# Patient Record
Sex: Female | Born: 1955 | Race: Black or African American | Hispanic: No | Marital: Married | Smoking: Former smoker
Health system: Southern US, Community
[De-identification: ages and names within clinical notes are randomized; demographics above are authoritative.]

## PROBLEM LIST (undated history)

## (undated) DIAGNOSIS — I10 Essential (primary) hypertension: Secondary | ICD-10-CM

## (undated) DIAGNOSIS — B1920 Unspecified viral hepatitis C without hepatic coma: Secondary | ICD-10-CM

---

## 2005-02-02 ENCOUNTER — Other Ambulatory Visit: Admission: RE | Admit: 2005-02-02 | Discharge: 2005-02-02 | Payer: Self-pay | Admitting: Obstetrics and Gynecology

## 2008-03-24 ENCOUNTER — Encounter: Admission: RE | Admit: 2008-03-24 | Discharge: 2008-03-24 | Payer: Self-pay | Admitting: Family Medicine

## 2008-03-30 ENCOUNTER — Encounter: Admission: RE | Admit: 2008-03-30 | Discharge: 2008-03-30 | Payer: Self-pay | Admitting: Family Medicine

## 2008-09-30 ENCOUNTER — Encounter: Admission: RE | Admit: 2008-09-30 | Discharge: 2008-09-30 | Payer: Self-pay | Admitting: Family Medicine

## 2009-04-07 ENCOUNTER — Encounter: Admission: RE | Admit: 2009-04-07 | Discharge: 2009-04-07 | Payer: Self-pay | Admitting: Family Medicine

## 2009-04-22 ENCOUNTER — Ambulatory Visit (HOSPITAL_COMMUNITY): Admission: RE | Admit: 2009-04-22 | Discharge: 2009-04-22 | Payer: Self-pay | Admitting: Obstetrics & Gynecology

## 2009-08-18 ENCOUNTER — Encounter: Admission: RE | Admit: 2009-08-18 | Discharge: 2009-08-18 | Payer: Self-pay | Admitting: Family Medicine

## 2010-05-05 ENCOUNTER — Ambulatory Visit (HOSPITAL_COMMUNITY): Admission: RE | Admit: 2010-05-05 | Discharge: 2010-05-05 | Payer: Self-pay | Admitting: Obstetrics & Gynecology

## 2011-03-13 LAB — BASIC METABOLIC PANEL
BUN: 9 mg/dL (ref 6–23)
CO2: 32 mEq/L (ref 19–32)
Calcium: 9.3 mg/dL (ref 8.4–10.5)
Chloride: 99 mEq/L (ref 96–112)
Creatinine, Ser: 0.75 mg/dL (ref 0.4–1.2)
GFR calc Af Amer: >60 mL/min (ref >60)
GFR calc non Af Amer: >60 mL/min (ref >60)
Glucose, Bld: 96 mg/dL (ref 70–99)
Potassium: 3.3 mEq/L — ABNORMAL LOW (ref 3.5–5.1)
Sodium: 137 mEq/L (ref 135–145)

## 2011-03-13 LAB — CBC
HCT: 43.6 % (ref 36.0–46.0)
Hemoglobin: 15.1 g/dL — ABNORMAL HIGH (ref 12.0–15.0)
MCHC: 34.8 g/dL (ref 30.0–36.0)
MCV: 90.3 fL (ref 78.0–100.0)
Platelets: 194 10*3/uL (ref 150–400)
RBC: 4.82 MIL/uL (ref 3.87–5.11)
RDW: 13 % (ref 11.5–15.5)
WBC: 5.9 10*3/uL (ref 4.0–10.5)

## 2011-05-22 ENCOUNTER — Other Ambulatory Visit: Payer: Self-pay | Admitting: Family Medicine

## 2011-05-22 DIAGNOSIS — Z1231 Encounter for screening mammogram for malignant neoplasm of breast: Secondary | ICD-10-CM

## 2011-06-07 ENCOUNTER — Ambulatory Visit: Payer: Self-pay

## 2011-09-03 ENCOUNTER — Ambulatory Visit: Payer: Self-pay

## 2011-09-06 ENCOUNTER — Ambulatory Visit
Admission: RE | Admit: 2011-09-06 | Discharge: 2011-09-06 | Disposition: A | Discharge status: Home / Self Care | Payer: Federal, State, Local not specified - PPO | Source: Ambulatory Visit | Attending: Family Medicine | Admitting: Family Medicine

## 2011-09-06 DIAGNOSIS — Z1231 Encounter for screening mammogram for malignant neoplasm of breast: Secondary | ICD-10-CM

## 2013-09-22 ENCOUNTER — Other Ambulatory Visit: Payer: Self-pay

## 2013-09-22 DIAGNOSIS — Z1231 Encounter for screening mammogram for malignant neoplasm of breast: Secondary | ICD-10-CM

## 2013-10-16 ENCOUNTER — Ambulatory Visit
Admission: RE | Admit: 2013-10-16 | Discharge: 2013-10-16 | Disposition: A | Discharge status: Home / Self Care | Payer: Federal, State, Local not specified - PPO | Source: Ambulatory Visit

## 2013-10-16 DIAGNOSIS — Z1231 Encounter for screening mammogram for malignant neoplasm of breast: Secondary | ICD-10-CM

## 2013-10-20 ENCOUNTER — Other Ambulatory Visit: Payer: Self-pay | Admitting: Family Medicine

## 2013-10-20 DIAGNOSIS — R928 Other abnormal and inconclusive findings on diagnostic imaging of breast: Secondary | ICD-10-CM

## 2013-11-04 ENCOUNTER — Ambulatory Visit
Admission: RE | Admit: 2013-11-04 | Discharge: 2013-11-04 | Disposition: A | Discharge status: Home / Self Care | Payer: Federal, State, Local not specified - PPO | Source: Ambulatory Visit | Attending: Family Medicine | Admitting: Family Medicine

## 2013-11-04 DIAGNOSIS — R928 Other abnormal and inconclusive findings on diagnostic imaging of breast: Secondary | ICD-10-CM

## 2014-01-13 ENCOUNTER — Other Ambulatory Visit: Payer: Self-pay | Admitting: Internal Medicine

## 2014-01-13 DIAGNOSIS — C220 Liver cell carcinoma: Secondary | ICD-10-CM

## 2014-01-14 ENCOUNTER — Ambulatory Visit: Payer: Self-pay | Admitting: Obstetrics & Gynecology

## 2014-01-22 ENCOUNTER — Ambulatory Visit
Admission: RE | Admit: 2014-01-22 | Discharge: 2014-01-22 | Disposition: A | Discharge status: Home / Self Care | Payer: Federal, State, Local not specified - PPO | Source: Ambulatory Visit | Attending: Internal Medicine | Admitting: Internal Medicine

## 2014-01-22 DIAGNOSIS — C220 Liver cell carcinoma: Secondary | ICD-10-CM

## 2014-05-19 ENCOUNTER — Other Ambulatory Visit: Payer: Self-pay | Admitting: Family Medicine

## 2014-05-19 DIAGNOSIS — R921 Mammographic calcification found on diagnostic imaging of breast: Secondary | ICD-10-CM

## 2014-08-04 ENCOUNTER — Inpatient Hospital Stay: Admission: RE | Admit: 2014-08-04 | Payer: Federal, State, Local not specified - PPO | Source: Ambulatory Visit

## 2014-08-09 ENCOUNTER — Encounter (INDEPENDENT_AMBULATORY_CARE_PROVIDER_SITE_OTHER): Payer: Self-pay

## 2014-08-09 ENCOUNTER — Ambulatory Visit
Admission: RE | Admit: 2014-08-09 | Discharge: 2014-08-09 | Disposition: A | Discharge status: Home / Self Care | Payer: Federal, State, Local not specified - PPO | Source: Ambulatory Visit | Attending: Family Medicine | Admitting: Family Medicine

## 2014-08-09 DIAGNOSIS — R921 Mammographic calcification found on diagnostic imaging of breast: Secondary | ICD-10-CM

## 2014-08-18 ENCOUNTER — Other Ambulatory Visit (HOSPITAL_COMMUNITY): Payer: Self-pay | Admitting: Nurse Practitioner

## 2014-08-18 DIAGNOSIS — B182 Chronic viral hepatitis C: Secondary | ICD-10-CM

## 2014-09-01 ENCOUNTER — Ambulatory Visit (HOSPITAL_COMMUNITY)
Admission: RE | Admit: 2014-09-01 | Discharge: 2014-09-01 | Disposition: A | Discharge status: Home / Self Care | Payer: Federal, State, Local not specified - PPO | Source: Ambulatory Visit | Attending: Nurse Practitioner | Admitting: Nurse Practitioner

## 2014-09-01 DIAGNOSIS — K824 Cholesterolosis of gallbladder: Secondary | ICD-10-CM | POA: Insufficient documentation

## 2014-09-01 DIAGNOSIS — B182 Chronic viral hepatitis C: Secondary | ICD-10-CM | POA: Insufficient documentation

## 2014-10-30 ENCOUNTER — Encounter (HOSPITAL_COMMUNITY): Payer: Self-pay

## 2014-10-30 ENCOUNTER — Emergency Department (HOSPITAL_COMMUNITY)
Admission: EM | Admit: 2014-10-30 | Discharge: 2014-10-30 | Disposition: A | Discharge status: Home / Self Care | Payer: Federal, State, Local not specified - PPO | Attending: Emergency Medicine | Admitting: Emergency Medicine

## 2014-10-30 DIAGNOSIS — M545 Low back pain, unspecified: Secondary | ICD-10-CM

## 2014-10-30 DIAGNOSIS — Z8619 Personal history of other infectious and parasitic diseases: Secondary | ICD-10-CM | POA: Diagnosis not present

## 2014-10-30 DIAGNOSIS — Z87891 Personal history of nicotine dependence: Secondary | ICD-10-CM | POA: Insufficient documentation

## 2014-10-30 DIAGNOSIS — M546 Pain in thoracic spine: Secondary | ICD-10-CM | POA: Diagnosis not present

## 2014-10-30 DIAGNOSIS — I10 Essential (primary) hypertension: Secondary | ICD-10-CM | POA: Insufficient documentation

## 2014-10-30 HISTORY — DX: Unspecified viral hepatitis C without hepatic coma: B19.20

## 2014-10-30 HISTORY — DX: Essential (primary) hypertension: I10

## 2014-10-30 LAB — CBC WITH DIFFERENTIAL/PLATELET
Basophils Absolute: 0 10*3/uL (ref 0.0–0.1)
Basophils Relative: 1 % (ref 0–1)
Eosinophils Absolute: 0 10*3/uL (ref 0.0–0.7)
Eosinophils Relative: 0 % (ref 0–5)
HCT: 43.1 % (ref 36.0–46.0)
Hemoglobin: 14.6 g/dL (ref 12.0–15.0)
Lymphocytes Relative: 52 % — ABNORMAL HIGH (ref 12–46)
Lymphs Abs: 2.8 10*3/uL (ref 0.7–4.0)
MCH: 29.1 pg (ref 26.0–34.0)
MCHC: 33.9 g/dL (ref 30.0–36.0)
MCV: 86 fL (ref 78.0–100.0)
Monocytes Absolute: 0.8 10*3/uL (ref 0.1–1.0)
Monocytes Relative: 15 % — ABNORMAL HIGH (ref 3–12)
Neutro Abs: 1.7 10*3/uL (ref 1.7–7.7)
Neutrophils Relative %: 32 % — ABNORMAL LOW (ref 43–77)
Platelets: 225 10*3/uL (ref 150–400)
RBC: 5.01 MIL/uL (ref 3.87–5.11)
RDW: 12.6 % (ref 11.5–15.5)
WBC: 5.4 10*3/uL (ref 4.0–10.5)

## 2014-10-30 LAB — URINE MICROSCOPIC-ADD ON

## 2014-10-30 LAB — URINALYSIS, ROUTINE W REFLEX MICROSCOPIC
Bilirubin Urine: NEGATIVE
Glucose, UA: NEGATIVE mg/dL
Hgb urine dipstick: NEGATIVE
Ketones, ur: NEGATIVE mg/dL
Nitrite: NEGATIVE
Protein, ur: NEGATIVE mg/dL
Specific Gravity, Urine: 1.017 (ref 1.005–1.030)
Urobilinogen, UA: 0.2 mg/dL (ref 0.0–1.0)
pH: 5.5 (ref 5.0–8.0)

## 2014-10-30 LAB — COMPREHENSIVE METABOLIC PANEL
ALT: 15 U/L (ref 0–35)
AST: 16 U/L (ref 0–37)
Albumin: 3.6 g/dL (ref 3.5–5.2)
Alkaline Phosphatase: 91 U/L (ref 39–117)
Anion gap: 13 (ref 5–15)
BUN: 8 mg/dL (ref 6–23)
CO2: 28 mEq/L (ref 19–32)
Calcium: 9.6 mg/dL (ref 8.4–10.5)
Chloride: 95 mEq/L — ABNORMAL LOW (ref 96–112)
Creatinine, Ser: 0.73 mg/dL (ref 0.50–1.10)
GFR calc Af Amer: >90 mL/min (ref >90)
GFR calc non Af Amer: >90 mL/min (ref >90)
Glucose, Bld: 99 mg/dL (ref 70–99)
Potassium: 3.7 mEq/L (ref 3.7–5.3)
Sodium: 136 mEq/L — ABNORMAL LOW (ref 137–147)
Total Bilirubin: 0.5 mg/dL (ref 0.3–1.2)
Total Protein: 7.7 g/dL (ref 6.0–8.3)

## 2014-10-30 MED ORDER — IBUPROFEN 600 MG PO TABS: 600.0000 mg | ORAL_TABLET | Freq: Three times a day (TID) | ORAL | Status: AC

## 2014-10-30 MED ORDER — DIAZEPAM 5 MG PO TABS
5.0000 mg | ORAL_TABLET | Freq: Once | ORAL | Status: AC
  Administered 2014-10-30: 5 mg via ORAL
  Filled 2014-10-30: qty 1

## 2014-10-30 MED ORDER — HYDROCODONE-ACETAMINOPHEN 5-325 MG PO TABS
1.0000 | ORAL_TABLET | Freq: Four times a day (QID) | ORAL | Status: AC | PRN
Start: 2014-10-30 — End: ?

## 2014-10-30 MED ORDER — KETOROLAC TROMETHAMINE 30 MG/ML IJ SOLN
30.0000 mg | Freq: Once | INTRAMUSCULAR | Status: AC
  Administered 2014-10-30: 30 mg via INTRAMUSCULAR
  Filled 2014-10-30: qty 1

## 2014-10-30 MED ORDER — DIAZEPAM 5 MG PO TABS: 5.0000 mg | ORAL_TABLET | Freq: Two times a day (BID) | ORAL | Status: AC

## 2014-10-30 NOTE — Discharge Instructions (Signed)
As discussed, your evaluation today has been largely reassuring.  But, it is important that you monitor your condition carefully, and do not hesitate to return to the ED if you develop new, or concerning changes in your condition.  Otherwise, please follow-up with your physician for appropriate ongoing care.  For the next 3 days please take all medication as prescribed, and use ice packs, 3 times daily for 20 minutes per application.

## 2014-10-30 NOTE — ED Notes (Signed)
The patient is unable to give an urine specimen at this time. The tech has advised the patient to use the call light for assistance to the restroom. The tech has reported to the RN in charge.

## 2014-10-30 NOTE — ED Notes (Signed)
Pt states "when I eat there is a gas/pressure build up in my back"  Pt reports this has been ongoing x 2 weeks.

## 2014-10-30 NOTE — ED Provider Notes (Signed)
CSN: [id number]     Arrival date & time 10/30/14  1500 History   First MD Initiated Contact with Patient 10/30/14 1628     Chief Complaint  Patient presents with  . Back Pain     HPI  Patient presents with concern of substantial back pain.  He began approximately 10 days ago, the day after the patient arrived after a long car. Since that time the patient has had pain persistently in the mid lower back, greater on the left.  Pain is sore, severe.  Pain does not radiate anteriorly. Pain seemingly is worse with by mouth intake. No change in bowel movements.  No urinary complaints.  No nausea, vomiting, anorexia.  No fever, chills.  Patient has not taken any medication for pain relief, though she did take Rolaids for presumed gas pain.  This did not change her symptoms. Patient has no history of abdominal surgery, beyond cesarean section. Patient does have history of hepatitis C, for which she is currently taking medication.   Past Medical History  Diagnosis Date  . Hypertension   . Hepatitis C    Past Surgical History  Procedure Laterality Date  . Cesarean section     No family history on file. History  Substance Use Topics  . Smoking status: Former Research scientist (life sciences)  . Smokeless tobacco: Not on file  . Alcohol Use: No   OB History    No data available     Review of Systems  Constitutional:       Per HPI, otherwise negative  HENT:       Per HPI, otherwise negative  Respiratory:       Per HPI, otherwise negative  Cardiovascular:       Per HPI, otherwise negative  Gastrointestinal: Negative for vomiting.  Endocrine:       Negative aside from HPI  Genitourinary:       Neg aside from HPI   Musculoskeletal:       Per HPI, otherwise negative  Skin: Negative.   Neurological: Negative for syncope.      Allergies  Review of patient's allergies indicates no known allergies.  Home Medications   Prior to Admission medications   Not on File   BP 137/76 mmHg  Pulse 74   Temp(Src) 98.3 F (36.8 C)  Resp 18  Ht 5\' 6"  (1.676 m)  Wt 200 lb (90.719 kg)  BMI 32.30 kg/m2  SpO2 98% Physical Exam  Constitutional: She is oriented to person, place, and time. She appears well-developed and well-nourished. No distress.  HENT:  Head: Normocephalic and atraumatic.  Eyes: Conjunctivae and EOM are normal.  Cardiovascular: Normal rate and regular rhythm.   Pulmonary/Chest: Effort normal and breath sounds normal. No stridor. No respiratory distress.  Abdominal: She exhibits no distension.  No tenderness anywhere on abdominal exam, no hepatosplenomegaly  Musculoskeletal: She exhibits no edema.       Arms: Neurological: She is alert and oriented to person, place, and time. No cranial nerve deficit.  Patient moves both lower extremities spontaneously, has no asymmetry of strength 5/5, with no gait or coordination abnormalities  Skin: Skin is warm and dry.  Psychiatric: She has a normal mood and affect.  Nursing note and vitals reviewed.   ED Course  Procedures (including critical care time) Labs Review Labs Reviewed  COMPREHENSIVE METABOLIC PANEL - Abnormal; Notable for the following:    Sodium 136 (*)    Chloride 95 (*)    All other components within normal  limits  CBC WITH DIFFERENTIAL - Abnormal; Notable for the following:    Neutrophils Relative % 32 (*)    Lymphocytes Relative 52 (*)    Monocytes Relative 15 (*)    All other components within normal limits  URINALYSIS, ROUTINE W REFLEX MICROSCOPIC - Abnormal; Notable for the following:    Leukocytes, UA TRACE (*)    All other components within normal limits  URINE MICROSCOPIC-ADD ON    7:47 PM Patient had substantial resolution of her pain, no new complaints.  MDM  Patient presents with severe low back, mid back pain. Patient's labs are reassuring, pain resolves with medication, and absent neurologic red flags, there is low suspicion for neurovascular compromise.  Patient discharged in stable  condition.    Carmin Muskrat, MD 10/30/14 [id number]

## 2014-12-20 ENCOUNTER — Encounter: Payer: Self-pay | Admitting: *Deleted

## 2016-01-11 ENCOUNTER — Other Ambulatory Visit: Payer: Self-pay

## 2016-01-11 DIAGNOSIS — Z1231 Encounter for screening mammogram for malignant neoplasm of breast: Secondary | ICD-10-CM

## 2016-01-19 ENCOUNTER — Ambulatory Visit: Payer: Federal, State, Local not specified - PPO

## 2016-01-25 ENCOUNTER — Ambulatory Visit
Admission: RE | Admit: 2016-01-25 | Discharge: 2016-01-25 | Disposition: A | Discharge status: Home / Self Care | Payer: Federal, State, Local not specified - PPO | Source: Ambulatory Visit

## 2016-01-25 DIAGNOSIS — Z1231 Encounter for screening mammogram for malignant neoplasm of breast: Secondary | ICD-10-CM

## 2020-01-18 ENCOUNTER — Other Ambulatory Visit: Payer: Self-pay | Admitting: Family Medicine

## 2020-01-18 DIAGNOSIS — Z1231 Encounter for screening mammogram for malignant neoplasm of breast: Secondary | ICD-10-CM

## 2020-01-18 DIAGNOSIS — E2839 Other primary ovarian failure: Secondary | ICD-10-CM

## 2020-01-25 ENCOUNTER — Other Ambulatory Visit: Payer: Self-pay | Admitting: Family Medicine

## 2020-01-25 DIAGNOSIS — Z1231 Encounter for screening mammogram for malignant neoplasm of breast: Secondary | ICD-10-CM

## 2020-01-25 DIAGNOSIS — E2839 Other primary ovarian failure: Secondary | ICD-10-CM

## 2020-01-25 DIAGNOSIS — N183 Chronic kidney disease, stage 3 unspecified: Secondary | ICD-10-CM

## 2020-04-04 ENCOUNTER — Other Ambulatory Visit: Payer: Self-pay | Admitting: Family Medicine

## 2020-04-04 DIAGNOSIS — Z780 Asymptomatic menopausal state: Secondary | ICD-10-CM

## 2020-04-13 ENCOUNTER — Other Ambulatory Visit: Payer: Self-pay

## 2020-04-13 ENCOUNTER — Ambulatory Visit
Admission: RE | Admit: 2020-04-13 | Discharge: 2020-04-13 | Disposition: A | Discharge status: Home / Self Care | Payer: Federal, State, Local not specified - PPO | Source: Ambulatory Visit | Attending: Family Medicine | Admitting: Family Medicine

## 2020-04-13 DIAGNOSIS — Z1231 Encounter for screening mammogram for malignant neoplasm of breast: Secondary | ICD-10-CM

## 2020-04-13 DIAGNOSIS — Z780 Asymptomatic menopausal state: Secondary | ICD-10-CM

## 2020-04-14 ENCOUNTER — Other Ambulatory Visit: Payer: Self-pay

## 2020-04-14 ENCOUNTER — Ambulatory Visit
Admission: RE | Admit: 2020-04-14 | Discharge: 2020-04-14 | Disposition: A | Discharge status: Home / Self Care | Payer: Federal, State, Local not specified - PPO | Source: Ambulatory Visit | Attending: Family Medicine | Admitting: Family Medicine

## 2020-04-14 ENCOUNTER — Ambulatory Visit: Payer: Self-pay

## 2020-04-14 DIAGNOSIS — N183 Chronic kidney disease, stage 3 unspecified: Secondary | ICD-10-CM

## 2021-05-02 IMAGING — MG DIGITAL SCREENING BILAT W/ TOMO W/ CAD
6 of 10 series · 6 of 30 positions shown · non-contrast
Comparison: Previous exam(s).

CLINICAL DATA: Screening.

EXAM:
DIGITAL SCREENING BILATERAL MAMMOGRAM WITH TOMO AND CAD

[L MLO synth-2D (1 of 2)]
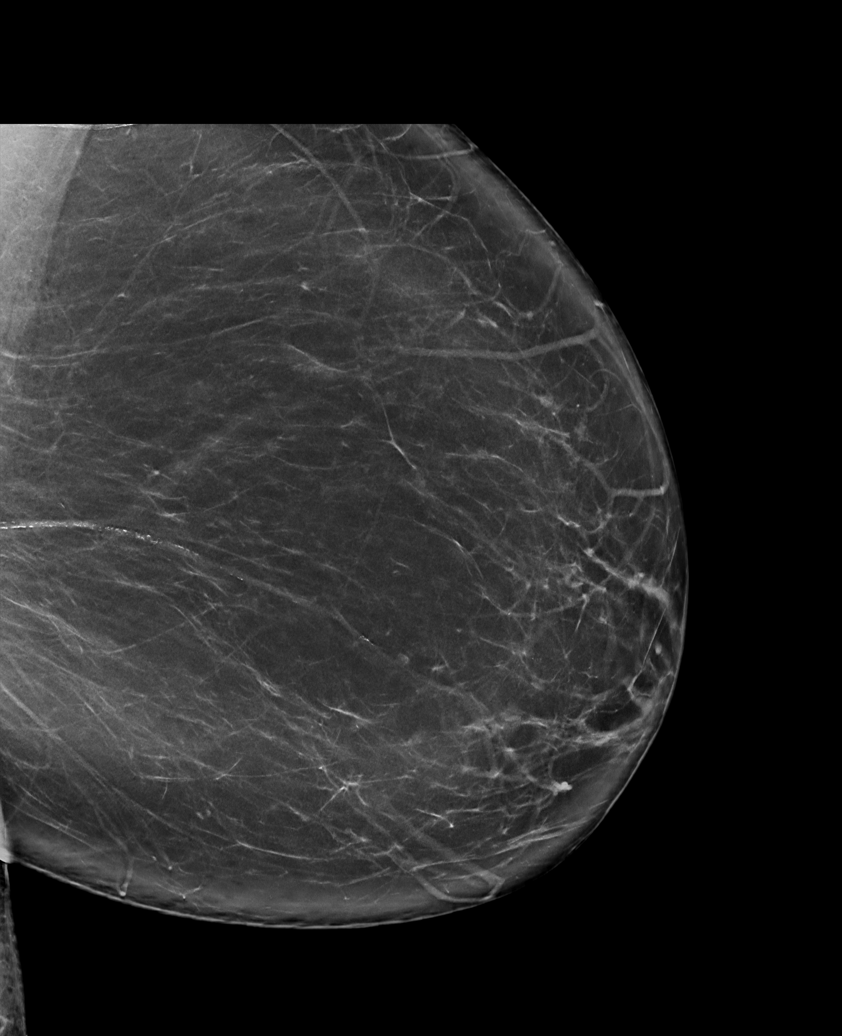

[L CC synth-2D]
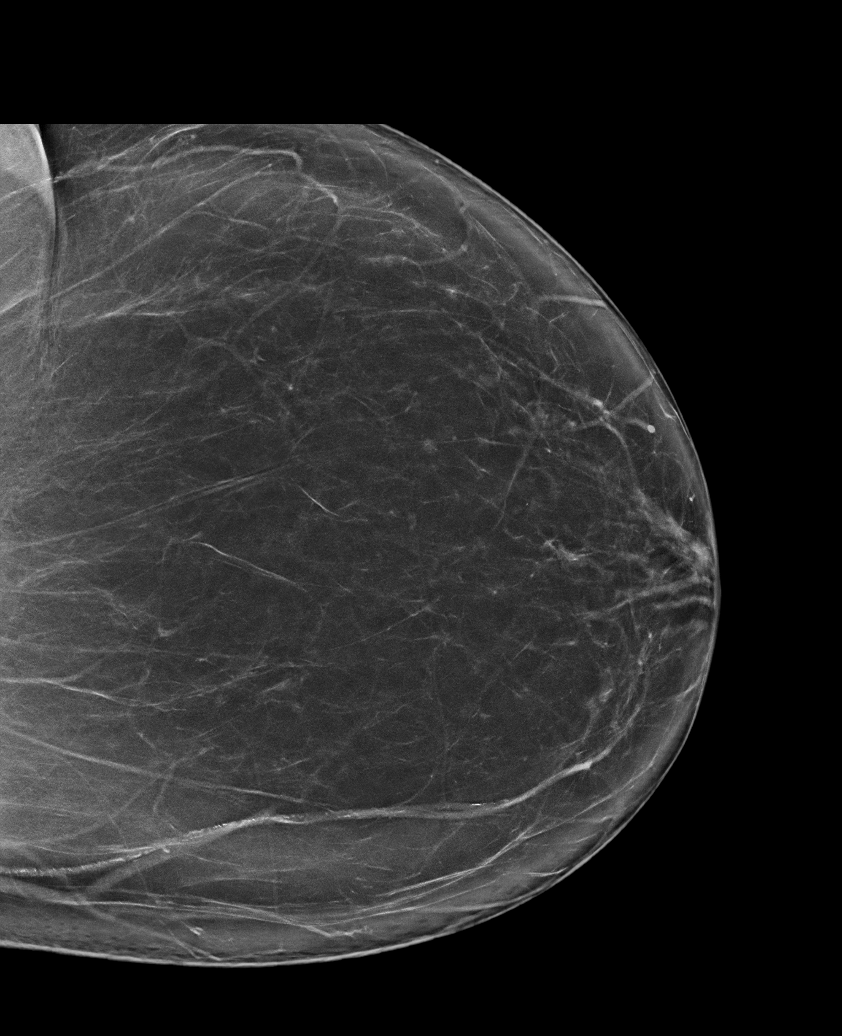

[R CC synth-2D]
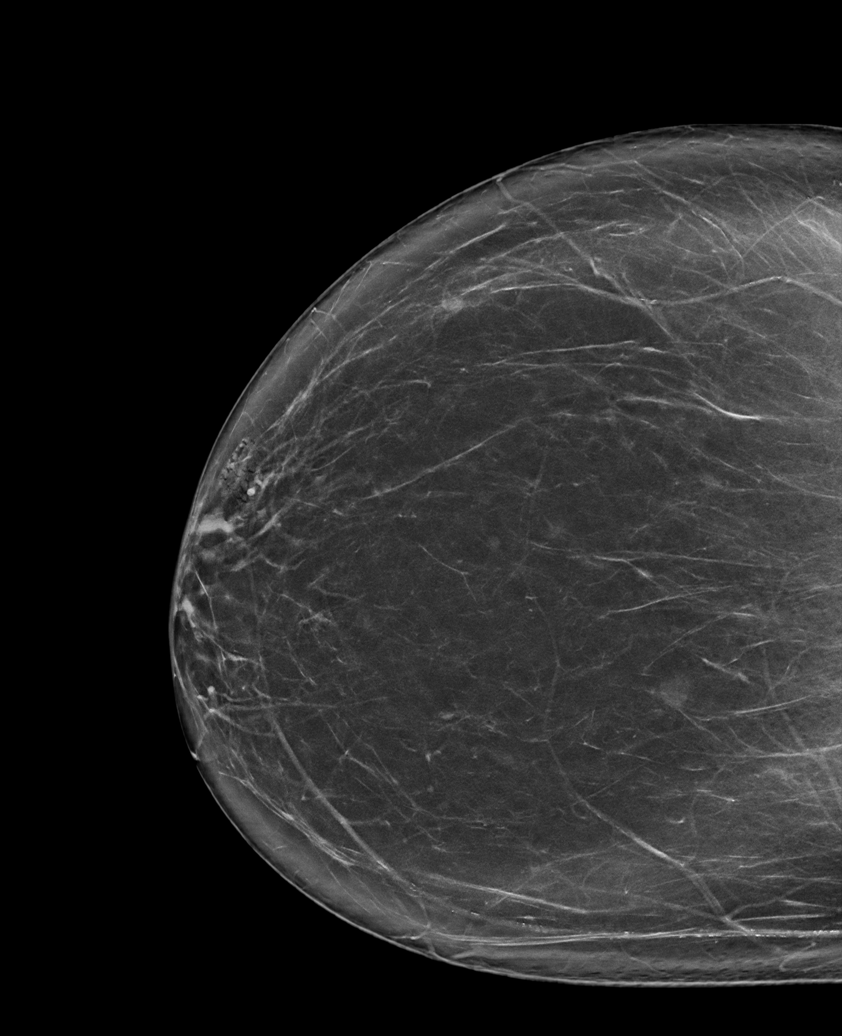

[L MLO synth-2D (2 of 2)]
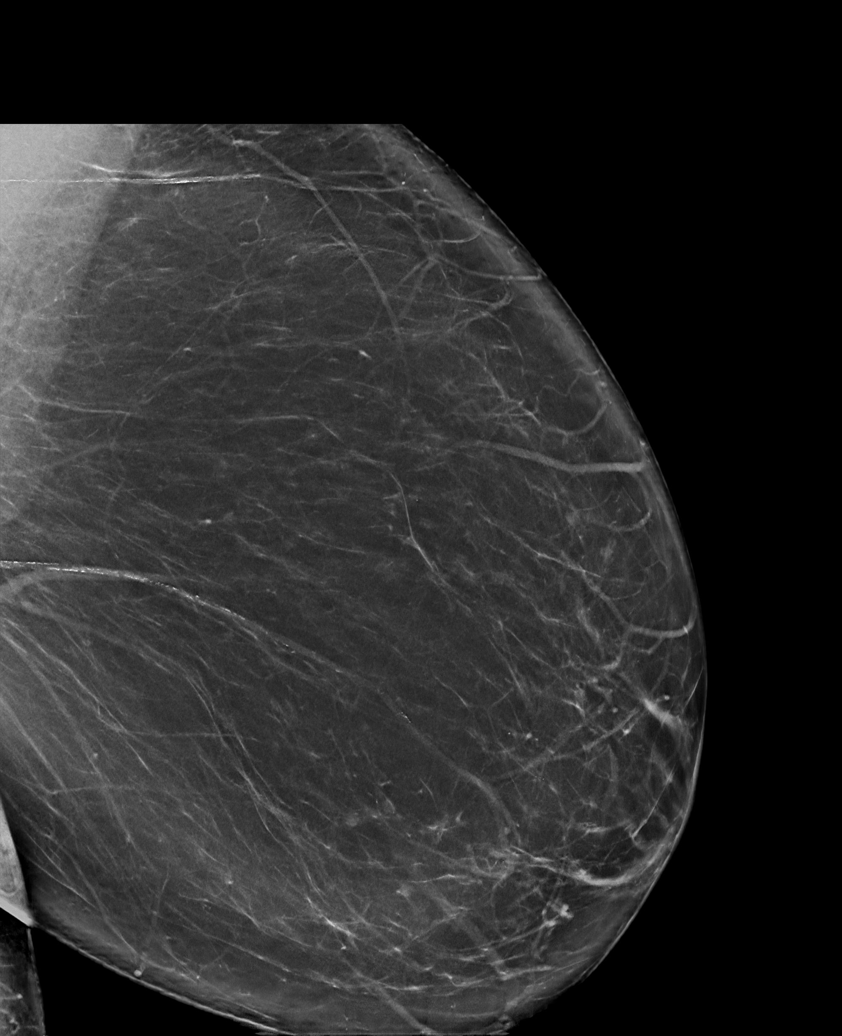

[R MLO synth-2D]
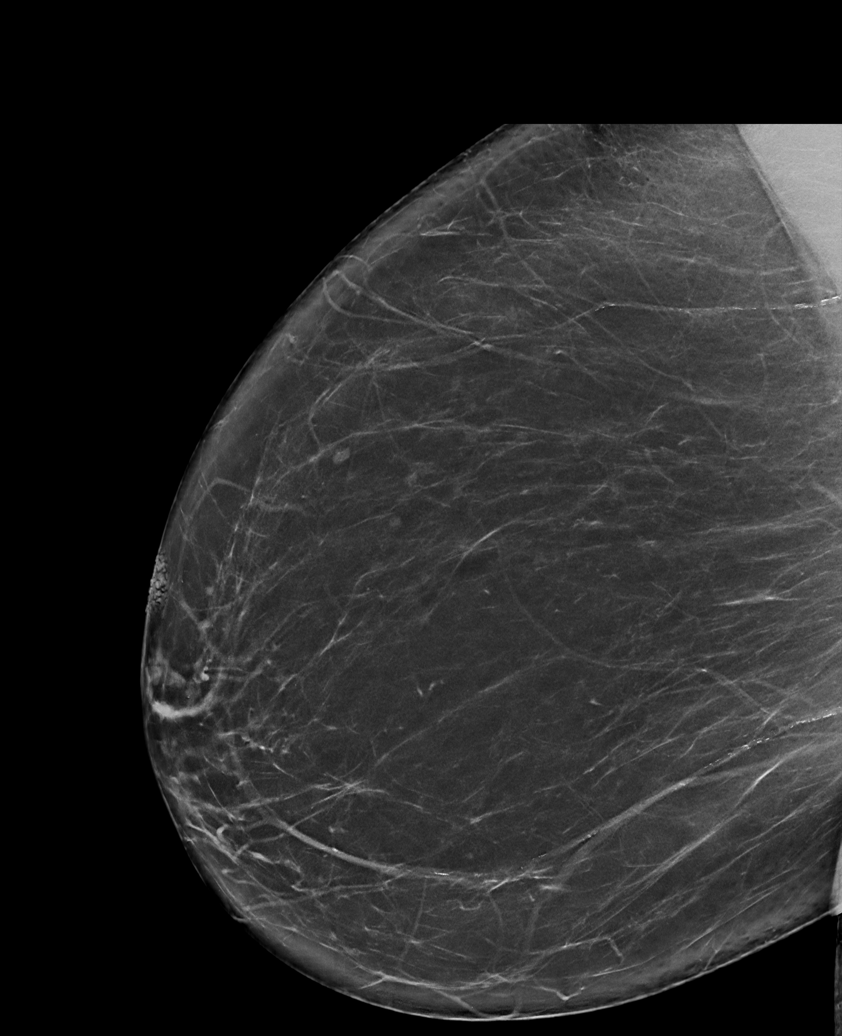

[L MLO tomo · tomo slice 48/95.0]
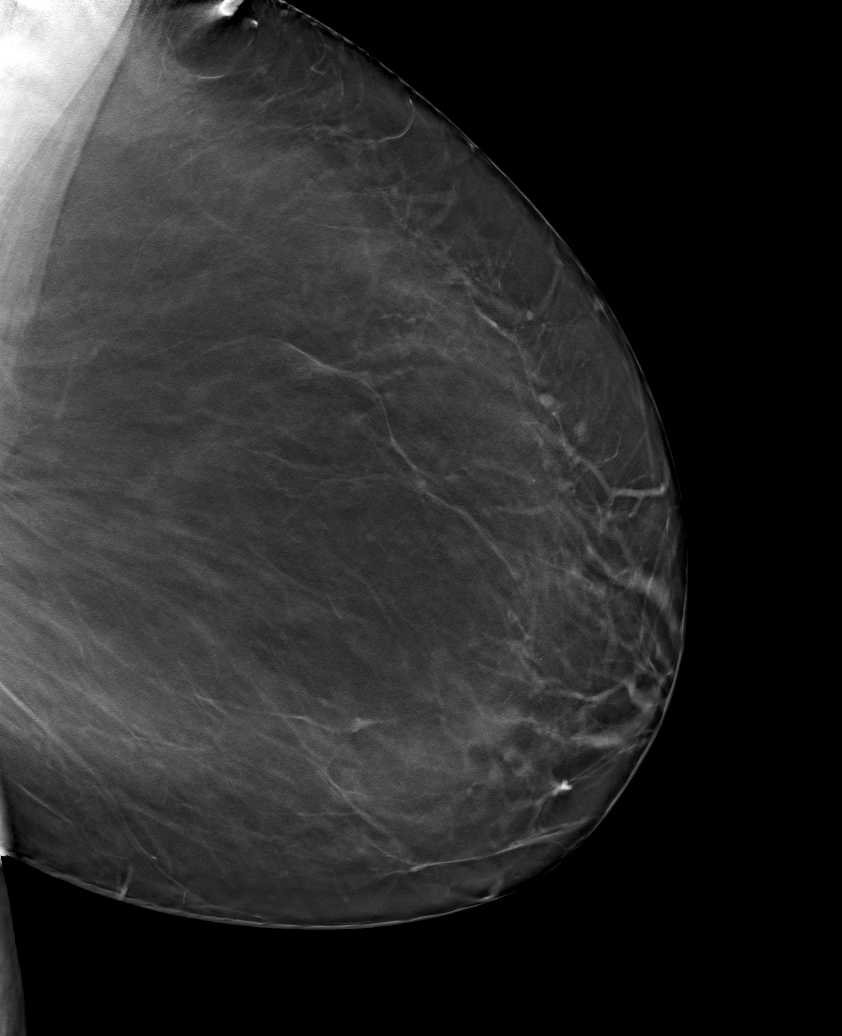

[6 of 30 positions shown; findings below may reference images not displayed]

ACR Breast Density Category b: There are scattered areas of
fibroglandular density.
FINDINGS: There are no findings suspicious for malignancy. Images were
processed with CAD.
IMPRESSION: No mammographic evidence of malignancy. A result letter of this
screening mammogram will be mailed directly to the patient.

RECOMMENDATION:
Screening mammogram in one year. (Code:[id number])

BI-RADS CATEGORY  1: Negative.

## 2023-10-14 ENCOUNTER — Other Ambulatory Visit: Payer: Self-pay | Admitting: Family Medicine

## 2023-10-14 DIAGNOSIS — Z1231 Encounter for screening mammogram for malignant neoplasm of breast: Secondary | ICD-10-CM

## 2023-11-12 ENCOUNTER — Ambulatory Visit
Admission: RE | Admit: 2023-11-12 | Discharge: 2023-11-12 | Disposition: A | Discharge status: Home / Self Care | Payer: Medicare Other | Source: Ambulatory Visit | Attending: Family Medicine | Admitting: Family Medicine

## 2023-11-12 DIAGNOSIS — Z1231 Encounter for screening mammogram for malignant neoplasm of breast: Secondary | ICD-10-CM
